# Patient Record
Sex: Female | Born: 1995 | Race: White | Hispanic: No | Marital: Single | Smoking: Current every day smoker
Health system: Southern US, Community
[De-identification: ages and names within clinical notes are randomized; demographics above are authoritative.]

## PROBLEM LIST (undated history)

## (undated) DIAGNOSIS — E663 Overweight: Secondary | ICD-10-CM

## (undated) DIAGNOSIS — L309 Dermatitis, unspecified: Secondary | ICD-10-CM

## (undated) DIAGNOSIS — J45909 Unspecified asthma, uncomplicated: Secondary | ICD-10-CM

## (undated) DIAGNOSIS — F419 Anxiety disorder, unspecified: Secondary | ICD-10-CM

## (undated) DIAGNOSIS — L409 Psoriasis, unspecified: Secondary | ICD-10-CM

## (undated) HISTORY — DX: Psoriasis, unspecified: L40.9

## (undated) HISTORY — DX: Dermatitis, unspecified: L30.9

## (undated) HISTORY — DX: Overweight: E66.3

## (undated) HISTORY — DX: Unspecified asthma, uncomplicated: J45.909

---
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
## 2016-02-14 ENCOUNTER — Encounter: Payer: Self-pay | Admitting: Pediatrics

## 2016-08-28 ENCOUNTER — Encounter (HOSPITAL_COMMUNITY): Payer: Self-pay | Admitting: *Deleted

## 2016-08-28 ENCOUNTER — Emergency Department (HOSPITAL_COMMUNITY)
Admission: EM | Admit: 2016-08-28 | Discharge: 2016-08-28 | Disposition: A | Discharge status: Home / Self Care | Payer: Medicaid Other | Attending: Emergency Medicine | Admitting: Emergency Medicine

## 2016-08-28 DIAGNOSIS — Z79899 Other long term (current) drug therapy: Secondary | ICD-10-CM | POA: Diagnosis not present

## 2016-08-28 DIAGNOSIS — K529 Noninfective gastroenteritis and colitis, unspecified: Secondary | ICD-10-CM | POA: Diagnosis not present

## 2016-08-28 DIAGNOSIS — F17210 Nicotine dependence, cigarettes, uncomplicated: Secondary | ICD-10-CM | POA: Diagnosis not present

## 2016-08-28 DIAGNOSIS — J4540 Moderate persistent asthma, uncomplicated: Secondary | ICD-10-CM | POA: Diagnosis not present

## 2016-08-28 DIAGNOSIS — R112 Nausea with vomiting, unspecified: Secondary | ICD-10-CM | POA: Diagnosis present

## 2016-08-28 LAB — URINALYSIS, ROUTINE W REFLEX MICROSCOPIC
BILIRUBIN URINE: NEGATIVE
Glucose, UA: NEGATIVE mg/dL
HGB URINE DIPSTICK: NEGATIVE
KETONES UR: NEGATIVE mg/dL
Leukocytes, UA: NEGATIVE
Nitrite: NEGATIVE
PROTEIN: NEGATIVE mg/dL
Specific Gravity, Urine: 1.02 (ref 1.005–1.030)
pH: 7 (ref 5.0–8.0)

## 2016-08-28 LAB — CBC
HCT: 40.7 % (ref 36.0–46.0)
Hemoglobin: 13.9 g/dL (ref 12.0–15.0)
MCH: 29.4 pg (ref 26.0–34.0)
MCHC: 34.2 g/dL (ref 30.0–36.0)
MCV: 86 fL (ref 78.0–100.0)
PLATELETS: 288 10*3/uL (ref 150–400)
RBC: 4.73 MIL/uL (ref 3.87–5.11)
RDW: 13.1 % (ref 11.5–15.5)
WBC: 13 10*3/uL — ABNORMAL HIGH (ref 4.0–10.5)

## 2016-08-28 LAB — COMPREHENSIVE METABOLIC PANEL
ALK PHOS: 82 U/L (ref 38–126)
ALT: 23 U/L (ref 14–54)
AST: 20 U/L (ref 15–41)
Albumin: 4.4 g/dL (ref 3.5–5.0)
Anion gap: 7 (ref 5–15)
BUN: 15 mg/dL (ref 6–20)
CALCIUM: 9 mg/dL (ref 8.9–10.3)
CO2: 24 mmol/L (ref 22–32)
CREATININE: 0.76 mg/dL (ref 0.44–1.00)
Chloride: 105 mmol/L (ref 101–111)
GFR calc non Af Amer: >60 mL/min (ref >60)
Glucose, Bld: 84 mg/dL (ref 65–99)
Potassium: 4 mmol/L (ref 3.5–5.1)
SODIUM: 136 mmol/L (ref 135–145)
Total Bilirubin: 0.6 mg/dL (ref 0.3–1.2)
Total Protein: 7.7 g/dL (ref 6.5–8.1)

## 2016-08-28 LAB — LIPASE, BLOOD: Lipase: 16 U/L (ref 11–51)

## 2016-08-28 LAB — PREGNANCY, URINE: PREG TEST UR: NEGATIVE

## 2016-08-28 MED ORDER — SODIUM CHLORIDE 0.9 % IV BOLUS (SEPSIS)
1000.0000 mL | Freq: Once | INTRAVENOUS | Status: AC
  Administered 2016-08-28: 1000 mL via INTRAVENOUS

## 2016-08-28 MED ORDER — ONDANSETRON HCL 4 MG/2ML IJ SOLN
4.0000 mg | Freq: Once | INTRAMUSCULAR | Status: AC
  Administered 2016-08-28: 4 mg via INTRAVENOUS
  Filled 2016-08-28: qty 2

## 2016-08-28 MED ORDER — ONDANSETRON HCL 4 MG PO TABS
4.0000 mg | ORAL_TABLET | Freq: Four times a day (QID) | ORAL | 0 refills | Status: DC
Start: 2016-08-28 — End: 2023-10-13

## 2016-08-28 NOTE — ED Triage Notes (Signed)
Pt has been having n/v/d for 4 days. States she has intermittent abdominal pain. States the person who lives with her was sick with the same symptoms. NAD noted.

## 2016-08-28 NOTE — ED Provider Notes (Signed)
[facility]-EMERGENCY DEPT Provider Note   CSN: [id number] Arrival date & time: 08/28/16  1336     History   Chief Complaint Chief Complaint  Patient presents with  . Abdominal Pain    HPI Savannah Contreras is a 21 y.o. female.  HPI Pt started having vomiting and diarrhea on Tuesday.  She has had 1-2 episodes of loose stools per day.  She has vomited up to 7 time per day though over the last few days.  She has persistent nausea and cannot eat or drink much.  She has stomach pain in the upper abdomen with vomiting.   No dysuria but decreased urine output.  She started to feel dizzy and lightheaded.  She talked to her doctor and was told to come to the ED.  Past Medical History:  Diagnosis Date  . Asthma   . Eczema   . Overweight 03/31/2013  . Psoriasis 03/31/2013  . Unspecified asthma(493.90) 03/31/2013    Patient Active Problem List   Diagnosis Date Noted  . Moderate persistent asthma 12/06/2013  . Allergic rhinitis 12/06/2013  . Unspecified asthma(493.90) 03/31/2013  . Overweight 03/31/2013  . Psoriasis 03/31/2013  . Ankle sprain 10/06/2012  . High ankle sprain 10/06/2012    History reviewed. No pertinent surgical history.  OB History    No data available       Home Medications    Prior to Admission medications   Medication Sig Start Date End Date Taking? Authorizing Provider  albuterol (PROVENTIL HFA;VENTOLIN HFA) 108 (90 Base) MCG/ACT inhaler Inhale 2 puffs into the lungs every 6 (six) hours as needed for wheezing or shortness of breath. 11/08/15  Yes Lurene Shadow, MD  Fructose-Dextrose-Phosphor Acd (ANTI-NAUSEA PO) Take 1 tablet by mouth once as needed (for nausea).   Yes Historical Provider, MD  Melatonin 5 MG TBDP Take 5 mg by mouth at bedtime.   Yes Historical Provider, MD  ondansetron (ZOFRAN) 4 MG tablet Take 1 tablet (4 mg total) by mouth every 6 (six) hours. 08/28/16   Linwood Dibbles, MD    Family History Family History  Problem Relation Age of Onset    . Heart disease    . Cancer    . Lung disease    . Diabetes      Social History Social History  Substance Use Topics  . Smoking status: Current Every Day Smoker    Packs/day: 1.00    Types: Cigarettes  . Smokeless tobacco: Never Used  . Alcohol use No     Allergies   Patient has no known allergies.   Review of Systems Review of Systems  All other systems reviewed and are negative.    Physical Exam Updated Vital Signs BP 116/66 (BP Location: Left Arm)   Pulse 87   Temp 97.5 F (36.4 C) (Oral)   Resp 15   Ht 5\' 6"  (1.676 m)   Wt 104.3 kg   LMP 05/28/2016 Comment: irregular  SpO2 98%   BMI 37.12 kg/m   Physical Exam  Constitutional: She appears well-developed and well-nourished. No distress.  HENT:  Head: Normocephalic and atraumatic.  Right Ear: External ear normal.  Left Ear: External ear normal.  Eyes: Conjunctivae are normal. Right eye exhibits no discharge. Left eye exhibits no discharge. No scleral icterus.  Neck: Neck supple. No tracheal deviation present.  Cardiovascular: Normal rate, regular rhythm and intact distal pulses.   Pulmonary/Chest: Effort normal and breath sounds normal. No stridor. No respiratory distress. She has no wheezes. She has  no rales.  Abdominal: Soft. Bowel sounds are normal. She exhibits no distension. There is no tenderness. There is no rebound and no guarding.  Musculoskeletal: She exhibits no edema or tenderness.  Neurological: She is alert. She has normal strength. No cranial nerve deficit (no facial droop, extraocular movements intact, no slurred speech) or sensory deficit. She exhibits normal muscle tone. She displays no seizure activity. Coordination normal.  Skin: Skin is warm and dry. No rash noted.  Psychiatric: She has a normal mood and affect.  Nursing note and vitals reviewed.    ED Treatments / Results  Labs (all labs ordered are listed, but only abnormal results are displayed) Labs Reviewed  CBC - Abnormal;  Notable for the following:       Result Value   WBC 13.0 (*)    All other components within normal limits  LIPASE, BLOOD  COMPREHENSIVE METABOLIC PANEL  URINALYSIS, ROUTINE W REFLEX MICROSCOPIC  PREGNANCY, URINE     Procedures Procedures (including critical care time)  Medications Ordered in ED Medications  sodium chloride 0.9 % bolus 1,000 mL (0 mLs Intravenous Stopped 08/28/16 2117)  ondansetron (ZOFRAN) injection 4 mg (4 mg Intravenous Given 08/28/16 2018)     Initial Impression / Assessment and Plan / ED Course  I have reviewed the triage vital signs and the nursing notes.  Pertinent labs & imaging results that were available during my care of the patient were reviewed by me and considered in my medical decision making (see chart for details).  Clinical Course     Benign exam.  Lab tests are reassuring.  Sx improved with treatment.  Suspect viral GE.  Doubt appendicitis, more serious infection.  Dc home with antiemetics.  Final Clinical Impressions(s) / ED Diagnoses   Final diagnoses:  Gastroenteritis    New Prescriptions Discharge Medication List as of 08/28/2016  9:31 PM    START taking these medications   Details  ondansetron (ZOFRAN) 4 MG tablet Take 1 tablet (4 mg total) by mouth every 6 (six) hours., Starting Thu 08/28/2016, Print         Linwood DibblesJon Sahasra Belue, MD 09/01/16 1101

## 2016-10-04 ENCOUNTER — Encounter (HOSPITAL_COMMUNITY): Payer: Self-pay | Admitting: Emergency Medicine

## 2016-10-04 ENCOUNTER — Emergency Department (HOSPITAL_COMMUNITY)
Admission: EM | Admit: 2016-10-04 | Discharge: 2016-10-04 | Disposition: A | Discharge status: Home / Self Care | Payer: Self-pay | Attending: Emergency Medicine | Admitting: Emergency Medicine

## 2016-10-04 ENCOUNTER — Emergency Department (HOSPITAL_COMMUNITY): Payer: Self-pay

## 2016-10-04 DIAGNOSIS — F17210 Nicotine dependence, cigarettes, uncomplicated: Secondary | ICD-10-CM | POA: Insufficient documentation

## 2016-10-04 DIAGNOSIS — Y999 Unspecified external cause status: Secondary | ICD-10-CM | POA: Insufficient documentation

## 2016-10-04 DIAGNOSIS — W010XXA Fall on same level from slipping, tripping and stumbling without subsequent striking against object, initial encounter: Secondary | ICD-10-CM | POA: Insufficient documentation

## 2016-10-04 DIAGNOSIS — S93402A Sprain of unspecified ligament of left ankle, initial encounter: Secondary | ICD-10-CM | POA: Insufficient documentation

## 2016-10-04 DIAGNOSIS — Y929 Unspecified place or not applicable: Secondary | ICD-10-CM | POA: Insufficient documentation

## 2016-10-04 DIAGNOSIS — J45909 Unspecified asthma, uncomplicated: Secondary | ICD-10-CM | POA: Insufficient documentation

## 2016-10-04 DIAGNOSIS — Y9389 Activity, other specified: Secondary | ICD-10-CM | POA: Insufficient documentation

## 2016-10-04 DIAGNOSIS — Z79899 Other long term (current) drug therapy: Secondary | ICD-10-CM | POA: Insufficient documentation

## 2016-10-04 MED ORDER — IBUPROFEN 600 MG PO TABS: 600.0000 mg | ORAL_TABLET | Freq: Four times a day (QID) | ORAL | 0 refills | Status: DC | PRN

## 2016-10-04 NOTE — ED Triage Notes (Signed)
Patient c/o left ankle pain. Per patient injured foot approx 30-35 minutes ago by" stepping off porch wrong." Patient reports hearing a "cracking noise."

## 2016-10-04 NOTE — Discharge Instructions (Signed)
Wear the ASO and use crutches to avoid weight bearing.  Use ice and elevation as much as possible for the next several days to help reduce the swelling.  Take the medications prescribed.   Use the ibuprofen also for pain and inflammation.  Call the orthopedic doctor listed for a recheck of your injury if you are not able to come off the crutches by then as discussed.    You may benefit from physical therapy of your ankle if it is not getting better over the next week.

## 2016-10-04 NOTE — ED Provider Notes (Signed)
[facility]-EMERGENCY DEPT Provider Note   CSN: [id number] Arrival date & time: 10/04/16  1627     History   Chief Complaint Chief Complaint  Patient presents with  . Ankle Pain    HPI Savannah Contreras is a 21 y.o. female presenting with left ankle pain which occurred suddenly when the patient inverted the ankle joint when she tripped and fell off a step.  Pain is aching, constant and worse with palpation, movement and weight bearing.  The patient was able to weight bear immediately after the event.  There is no radiation of pain and the patient denies numbness distal to the injury site.  She has had no treatment prior to arrival.  The history is provided by the patient.    Past Medical History:  Diagnosis Date  . Asthma   . Eczema   . Overweight 03/31/2013  . Psoriasis 03/31/2013  . Unspecified asthma(493.90) 03/31/2013    Patient Active Problem List   Diagnosis Date Noted  . Moderate persistent asthma 12/06/2013  . Allergic rhinitis 12/06/2013  . Unspecified asthma(493.90) 03/31/2013  . Overweight 03/31/2013  . Psoriasis 03/31/2013  . Ankle sprain 10/06/2012  . High ankle sprain 10/06/2012    History reviewed. No pertinent surgical history.  OB History    No data available       Home Medications    Prior to Admission medications   Medication Sig Start Date End Date Taking? Authorizing Provider  albuterol (PROVENTIL HFA;VENTOLIN HFA) 108 (90 Base) MCG/ACT inhaler Inhale 2 puffs into the lungs every 6 (six) hours as needed for wheezing or shortness of breath. 11/08/15   Lurene ShadowKavithashree Gnanasekaran, MD  Fructose-Dextrose-Phosphor Acd (ANTI-NAUSEA PO) Take 1 tablet by mouth once as needed (for nausea).    Historical Provider, MD  ibuprofen (ADVIL,MOTRIN) 600 MG tablet Take 1 tablet (600 mg total) by mouth every 6 (six) hours as needed. 10/04/16   Burgess AmorJulie Tiye Huwe, PA-C  Melatonin 5 MG TBDP Take 5 mg by mouth at bedtime.    Historical Provider, MD  ondansetron (ZOFRAN) 4 MG tablet  Take 1 tablet (4 mg total) by mouth every 6 (six) hours. 08/28/16   Linwood DibblesJon Knapp, MD    Family History Family History  Problem Relation Age of Onset  . Heart disease    . Cancer    . Lung disease    . Diabetes      Social History Social History  Substance Use Topics  . Smoking status: Current Every Day Smoker    Packs/day: 1.00    Years: 2.00    Types: Cigarettes  . Smokeless tobacco: Never Used  . Alcohol use No     Allergies   Patient has no known allergies.   Review of Systems Review of Systems  Musculoskeletal: Positive for arthralgias and joint swelling.  Skin: Negative for wound.  Neurological: Negative for weakness and numbness.     Physical Exam Updated Vital Signs BP 119/75 (BP Location: Left Arm)   Pulse 84   Temp 98.2 F (36.8 C) (Oral)   Resp 18   Ht 5\' 6"  (1.676 m)   Wt 110.2 kg   LMP 10/04/2016   SpO2 99%   BMI 39.22 kg/m   Physical Exam  Constitutional: She appears well-developed and well-nourished.  HENT:  Head: Normocephalic.  Cardiovascular: Normal rate and intact distal pulses.  Exam reveals no decreased pulses.   Pulses:      Dorsalis pedis pulses are 2+ on the right side, and 2+ on the  left side.       Posterior tibial pulses are 2+ on the right side, and 2+ on the left side.  Musculoskeletal: She exhibits edema and tenderness.       Right ankle: Normal.       Left ankle: She exhibits decreased range of motion and swelling. She exhibits no ecchymosis, no deformity and normal pulse. Tenderness. Lateral malleolus tenderness found. No head of 5th metatarsal and no proximal fibula tenderness found. Achilles tendon normal.  Neurological: She is alert. No sensory deficit.  Skin: Skin is warm, dry and intact.  Nursing note and vitals reviewed.    ED Treatments / Results  Labs (all labs ordered are listed, but only abnormal results are displayed) Labs Reviewed - No data to display  EKG  EKG Interpretation None       Radiology Dg  Ankle Complete Left  Result Date: 10/04/2016 CLINICAL DATA:  Injured foot 30-35 minutes ago stepping off a porch wrong, heard a cracking noise, LEFT ankle pain, initial encounter EXAM: LEFT ANKLE COMPLETE - 3+ VIEW COMPARISON:  None FINDINGS: Soft tissue swelling greatest laterally. Osseous mineralization normal. Joint spaces preserved. No acute fracture, dislocation, or bone destruction. IMPRESSION: No acute abnormalities. Electronically Signed   By: Ulyses Southward M.D.   On: 10/04/2016 17:35    Procedures Procedures (including critical care time)  Medications Ordered in ED Medications - No data to display   Initial Impression / Assessment and Plan / ED Course  I have reviewed the triage vital signs and the nursing notes.  Pertinent labs & imaging results that were available during my care of the patient were reviewed by me and considered in my medical decision making (see chart for details).    RICE, aso, crutches. Prn f/u with ortho for persistent sx.  Final Clinical Impressions(s) / ED Diagnoses   Final diagnoses:  Sprain of left ankle, unspecified ligament, initial encounter    New Prescriptions New Prescriptions   IBUPROFEN (ADVIL,MOTRIN) 600 MG TABLET    Take 1 tablet (600 mg total) by mouth every 6 (six) hours as needed.     Burgess Amor, PA-C 10/04/16 1752    Linwood Dibbles, MD 10/05/16 [phone]

## 2017-11-29 ENCOUNTER — Encounter (HOSPITAL_COMMUNITY): Payer: Self-pay | Admitting: *Deleted

## 2017-11-29 ENCOUNTER — Emergency Department (HOSPITAL_COMMUNITY)
Admission: EM | Admit: 2017-11-29 | Discharge: 2017-11-29 | Disposition: A | Discharge status: Home / Self Care | Payer: Self-pay | Attending: Emergency Medicine | Admitting: Emergency Medicine

## 2017-11-29 ENCOUNTER — Other Ambulatory Visit: Payer: Self-pay

## 2017-11-29 DIAGNOSIS — J45909 Unspecified asthma, uncomplicated: Secondary | ICD-10-CM | POA: Insufficient documentation

## 2017-11-29 DIAGNOSIS — R064 Hyperventilation: Secondary | ICD-10-CM | POA: Insufficient documentation

## 2017-11-29 DIAGNOSIS — F419 Anxiety disorder, unspecified: Secondary | ICD-10-CM | POA: Insufficient documentation

## 2017-11-29 DIAGNOSIS — F17210 Nicotine dependence, cigarettes, uncomplicated: Secondary | ICD-10-CM | POA: Insufficient documentation

## 2017-11-29 DIAGNOSIS — Z79899 Other long term (current) drug therapy: Secondary | ICD-10-CM | POA: Insufficient documentation

## 2017-11-29 HISTORY — DX: Anxiety disorder, unspecified: F41.9

## 2017-11-29 MED ORDER — LORAZEPAM 1 MG PO TABS
1.0000 mg | ORAL_TABLET | Freq: Once | ORAL | Status: AC
  Administered 2017-11-29: 1 mg via ORAL
  Filled 2017-11-29: qty 1

## 2017-11-29 NOTE — Discharge Instructions (Addendum)
Look at the resource guide to be evaluated for your anxiety and depression. Return to the ED if you feel like you may do something to harm yourself.

## 2017-11-29 NOTE — ED Triage Notes (Signed)
Pt states that she was at work when she felt "hot" started feeling dizzy, sob, was having tingling in her hands and face area, denies any pain,

## 2017-11-29 NOTE — ED Provider Notes (Signed)
[facility] EMERGENCY DEPARTMENT Provider Note   CSN: [id number] Arrival date & time: 11/29/17  0040  Time seen 12:55 AM   History   Chief Complaint Chief Complaint  Patient presents with  . Shortness of Breath    HPI Savannah Contreras is a 22 y.o. female.  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  She states they were spraying for bugs at the time she was working which is not usual.  She states they normally spray before the cleaning people arrived.  She states about 20 minutes after getting to work she started feeling dizzy, her face was tingling as well as her hands and feet and she felt dyspnea on exertion.  She denies wheezing and states she has had asthma in the past and this feels different.  Her mother states patient has lots of anxiety.  She has had a lot of stress this week because her friend's mother was murdered.  Patient's mother states "she is very sensitive to everything".  Mother also expresses concern that the patient is depressed, she states she sleeps all the time, she does not eat well, she is sad and cries a lot.  Patient denies being depressed.  Mother states she has had no concerned that the patient is suicidal.  Mother states she had an episode of anxiety attack once that was bad enough they called EMS.  That was several years ago.  Patient also reports her smoking has increased recently.  PCP Patient, No Pcp Per   Past Medical History:  Diagnosis Date  . Anxiety   . Asthma   . Eczema   . Overweight 03/31/2013  . Psoriasis 03/31/2013  . Unspecified asthma(493.90) 03/31/2013    Patient Active Problem List   Diagnosis Date Noted  . Moderate persistent asthma 12/06/2013  . Allergic rhinitis 12/06/2013  . Unspecified asthma(493.90) 03/31/2013  . Overweight 03/31/2013  . Psoriasis 03/31/2013  . Ankle sprain 10/06/2012  . High ankle sprain 10/06/2012    History reviewed. No pertinent surgical history.   OB History   None       Home Medications    Prior to Admission medications   Medication Sig Start Date End Date Taking? Authorizing Provider  albuterol (PROVENTIL HFA;VENTOLIN HFA) 108 (90 Base) MCG/ACT inhaler Inhale 2 puffs into the lungs every 6 (six) hours as needed for wheezing or shortness of breath. 11/08/15   Lurene Shadow, MD  Fructose-Dextrose-Phosphor Acd (ANTI-NAUSEA PO) Take 1 tablet by mouth once as needed (for nausea).    [provider]  ibuprofen (ADVIL,MOTRIN) 600 MG tablet Take 1 tablet (600 mg total) by mouth every 6 (six) hours as needed. 10/04/16   Burgess Amor, PA-C  Melatonin 5 MG TBDP Take 5 mg by mouth at bedtime.    [provider]  ondansetron (ZOFRAN) 4 MG tablet Take 1 tablet (4 mg total) by mouth every 6 (six) hours. 08/28/16   Linwood Dibbles, MD    Family History Family History  Problem Relation Age of Onset  . Heart disease Unknown   . Cancer Unknown   . Lung disease Unknown   . Diabetes Unknown     Social History Social History   Tobacco Use  . Smoking status: Current Every Day Smoker    Packs/day: 1.00    Years: 2.00    Pack years: 2.00    Types: Cigarettes  . Smokeless tobacco: Never Used  Substance Use Topics  . Alcohol use: No  .  Drug use: No  employed Smoking 1 1/2 ppd Lives with GM   Allergies   Patient has no known allergies.   Review of Systems Review of Systems  All other systems reviewed and are negative.    Physical Exam Updated Vital Signs BP 104/64   Pulse 61   Temp 98.6 F (37 C) (Oral)   Resp 17   Ht 5\' 6"  (1.676 m)   Wt 107 kg (236 lb)   LMP 11/22/2017   SpO2 95%   BMI 38.09 kg/m   Physical Exam  Constitutional: She is oriented to person, place, and time. She appears well-developed and well-nourished.  Non-toxic appearance. She does not appear ill. No distress.  HENT:  Head: Normocephalic and atraumatic.  Right Ear: External ear normal.  Left Ear: External ear normal.  Nose: Nose normal. No  mucosal edema or rhinorrhea.  Mouth/Throat: Oropharynx is clear and moist and mucous membranes are normal. No dental abscesses or uvula swelling.  Eyes: Pupils are equal, round, and reactive to light. Conjunctivae and EOM are normal.  Neck: Normal range of motion and full passive range of motion without pain. Neck supple.  Cardiovascular: Normal rate, regular rhythm and normal heart sounds. Exam reveals no gallop and no friction rub.  No murmur heard. Pulmonary/Chest: Effort normal and breath sounds normal. Tachypnea noted. No respiratory distress. She has no wheezes. She has no rhonchi. She has no rales. She exhibits no tenderness and no crepitus.  Abdominal: Soft. Normal appearance and bowel sounds are normal. She exhibits no distension. There is no tenderness. There is no rebound and no guarding.  Musculoskeletal: Normal range of motion. She exhibits no edema or tenderness.  Moves all extremities well.   Neurological: She is alert and oriented to person, place, and time. She has normal strength. No cranial nerve deficit.  She is complaining of her face, hands, and feet being tingly, she describes early carpal pedal spasm prior to coming to the ED but not currently.  Skin: Skin is warm, dry and intact. No rash noted. No erythema. No pallor.  Psychiatric: Her speech is normal and behavior is normal. Her mood appears anxious.  Nursing note and vitals reviewed.    ED Treatments / Results  Labs (all labs ordered are listed, but only abnormal results are displayed) Labs Reviewed - No data to display  EKG None  Radiology No results found.  Procedures Procedures (including critical care time)  Medications Ordered in ED Medications  LORazepam (ATIVAN) tablet 1 mg (1 mg Oral Given 11/29/17 0113)     Initial Impression / Assessment and Plan / ED Course  I have reviewed the triage vital signs and the nursing notes.  Pertinent labs & imaging results that were available during my care of  the patient were reviewed by me and considered in my medical decision making (see chart for details).     Patient appears to be having a panic attack and hyperventilation.  She was given Ativan orally.  Recheck at 1:50 AM patient appears to be much calmer.  She is no longer appearing tachypneic.  She states the numbness is gone although sometimes it comes back briefly.  This point she was felt to be stable at home for discharge.  Final Clinical Impressions(s) / ED Diagnoses   Final diagnoses:  Anxiety  Hyperventilation    ED Discharge Orders    None     Plan discharge  Devoria AlbeIva Javonni Macke, MD, Concha PyoFACEP    Denetria Luevanos, MD 11/29/17 [phone]

## 2018-03-11 IMAGING — DX DG ANKLE COMPLETE 3+V*L*
3 series · 3 of 3 positions shown · non-contrast
Comparison: None

CLINICAL DATA: Injured foot 30-35 minutes ago stepping off a porch
wrong, heard a cracking noise, LEFT ankle pain, initial encounter

EXAM:
LEFT ANKLE COMPLETE - 3+ VIEW

[ankle ap]
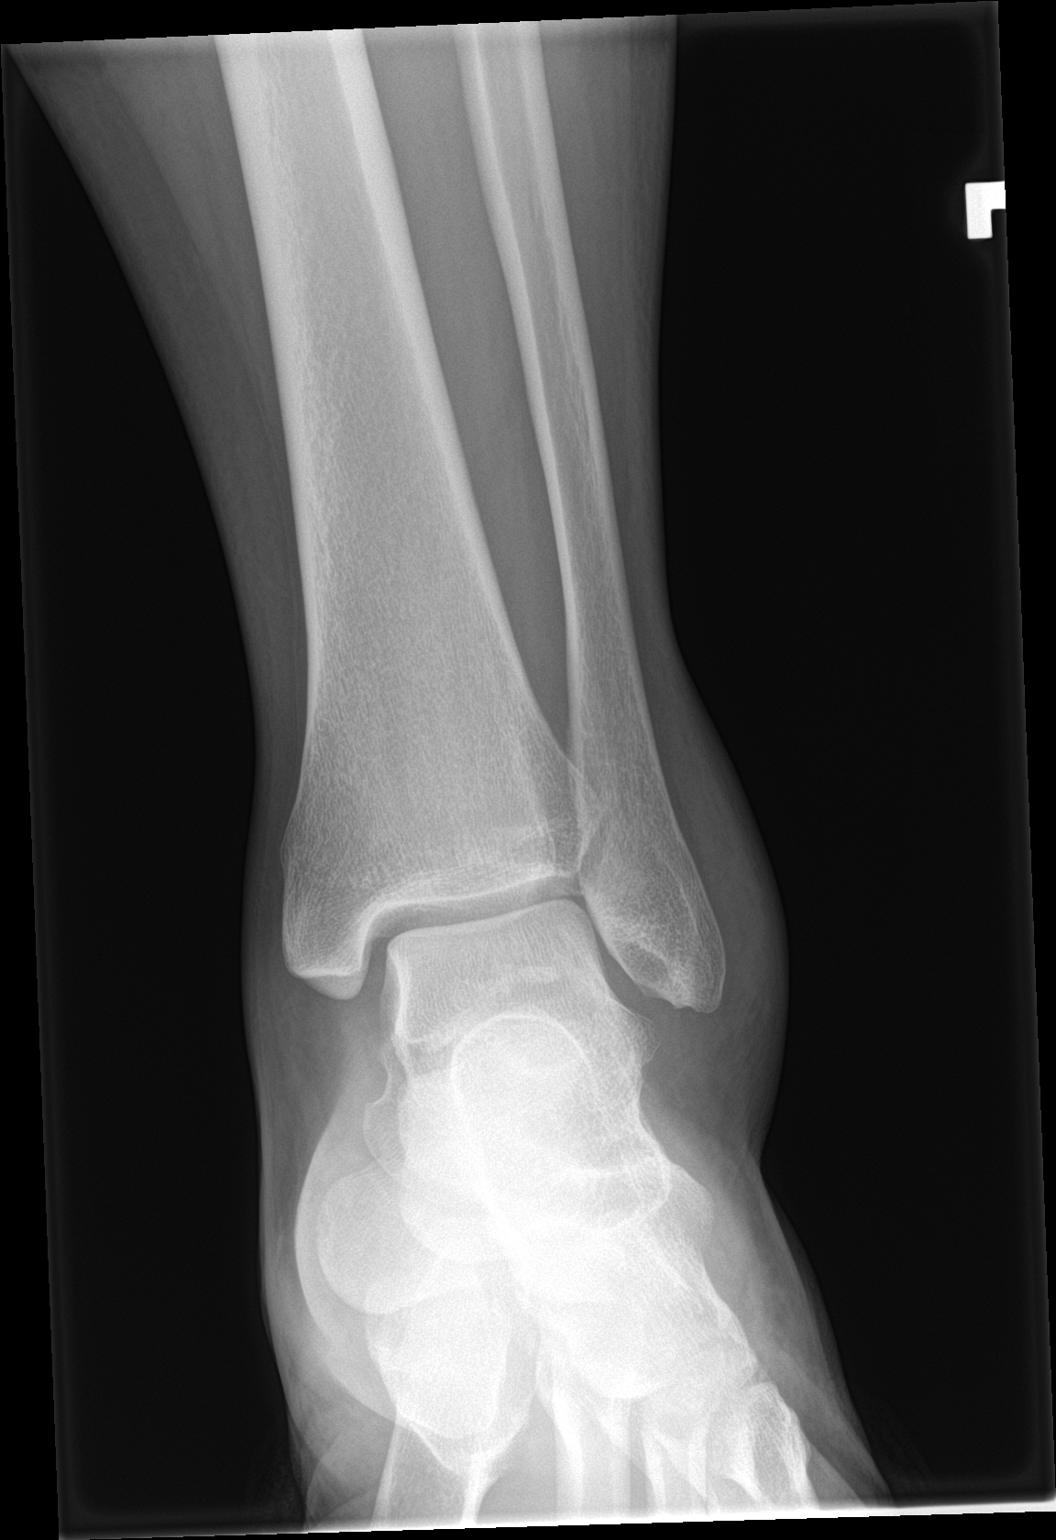

[ankle obl]
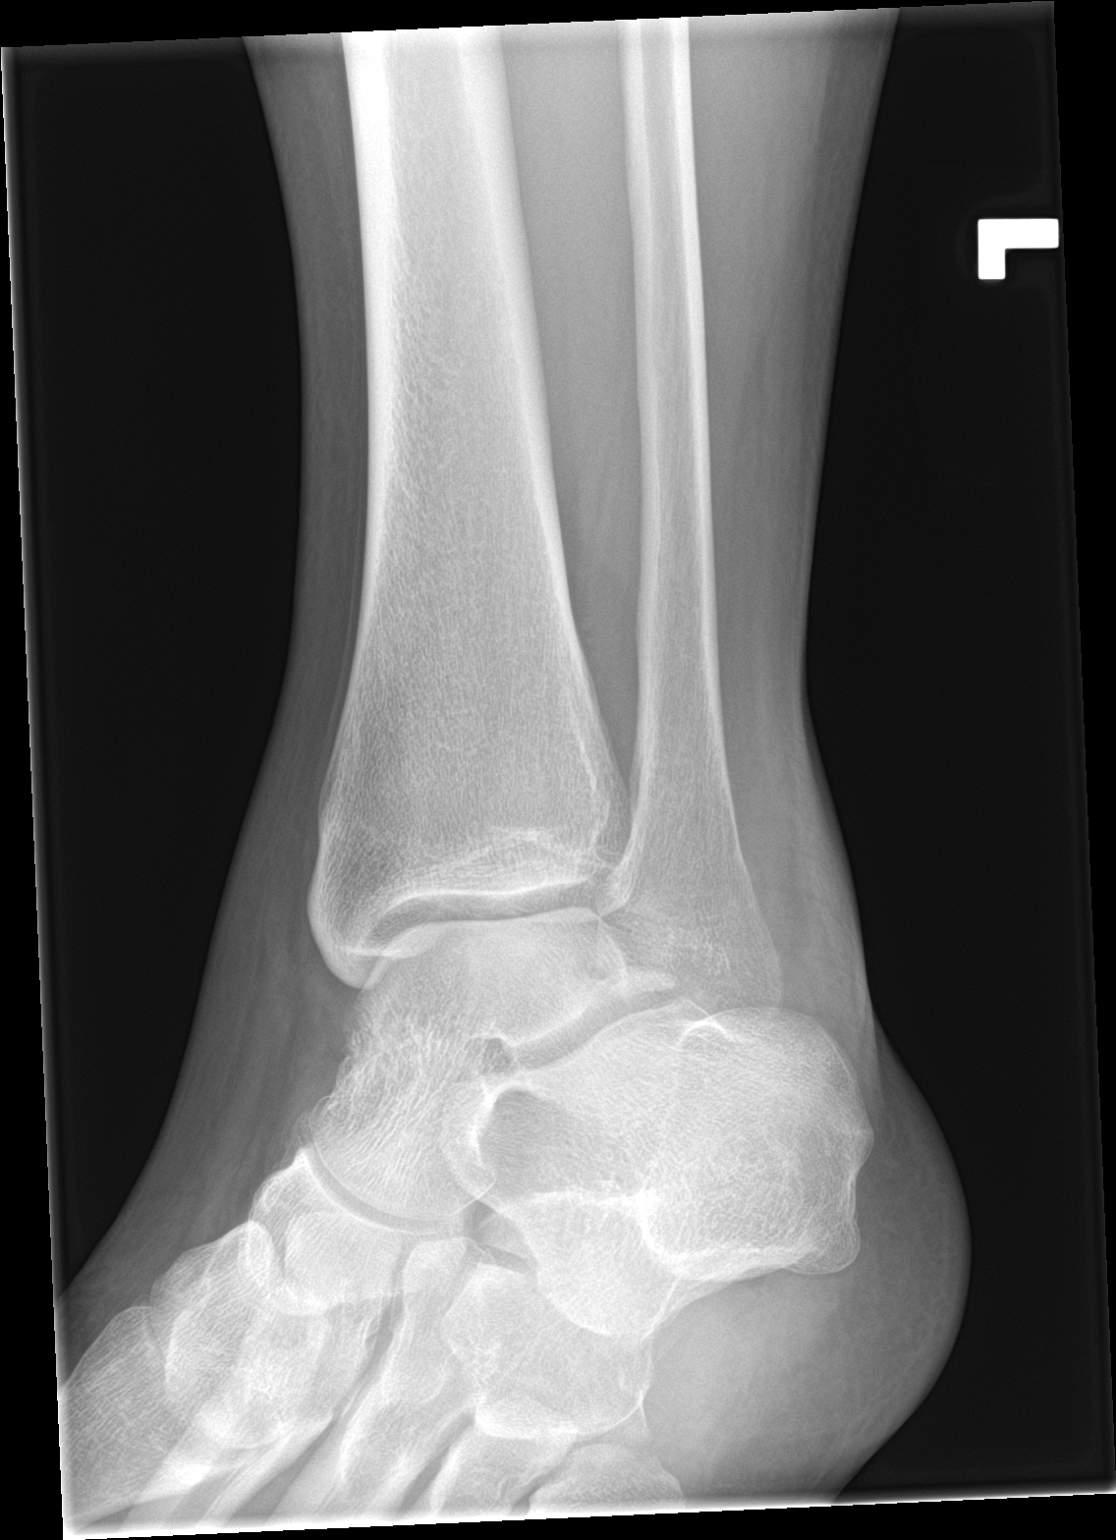

[ankle lat]
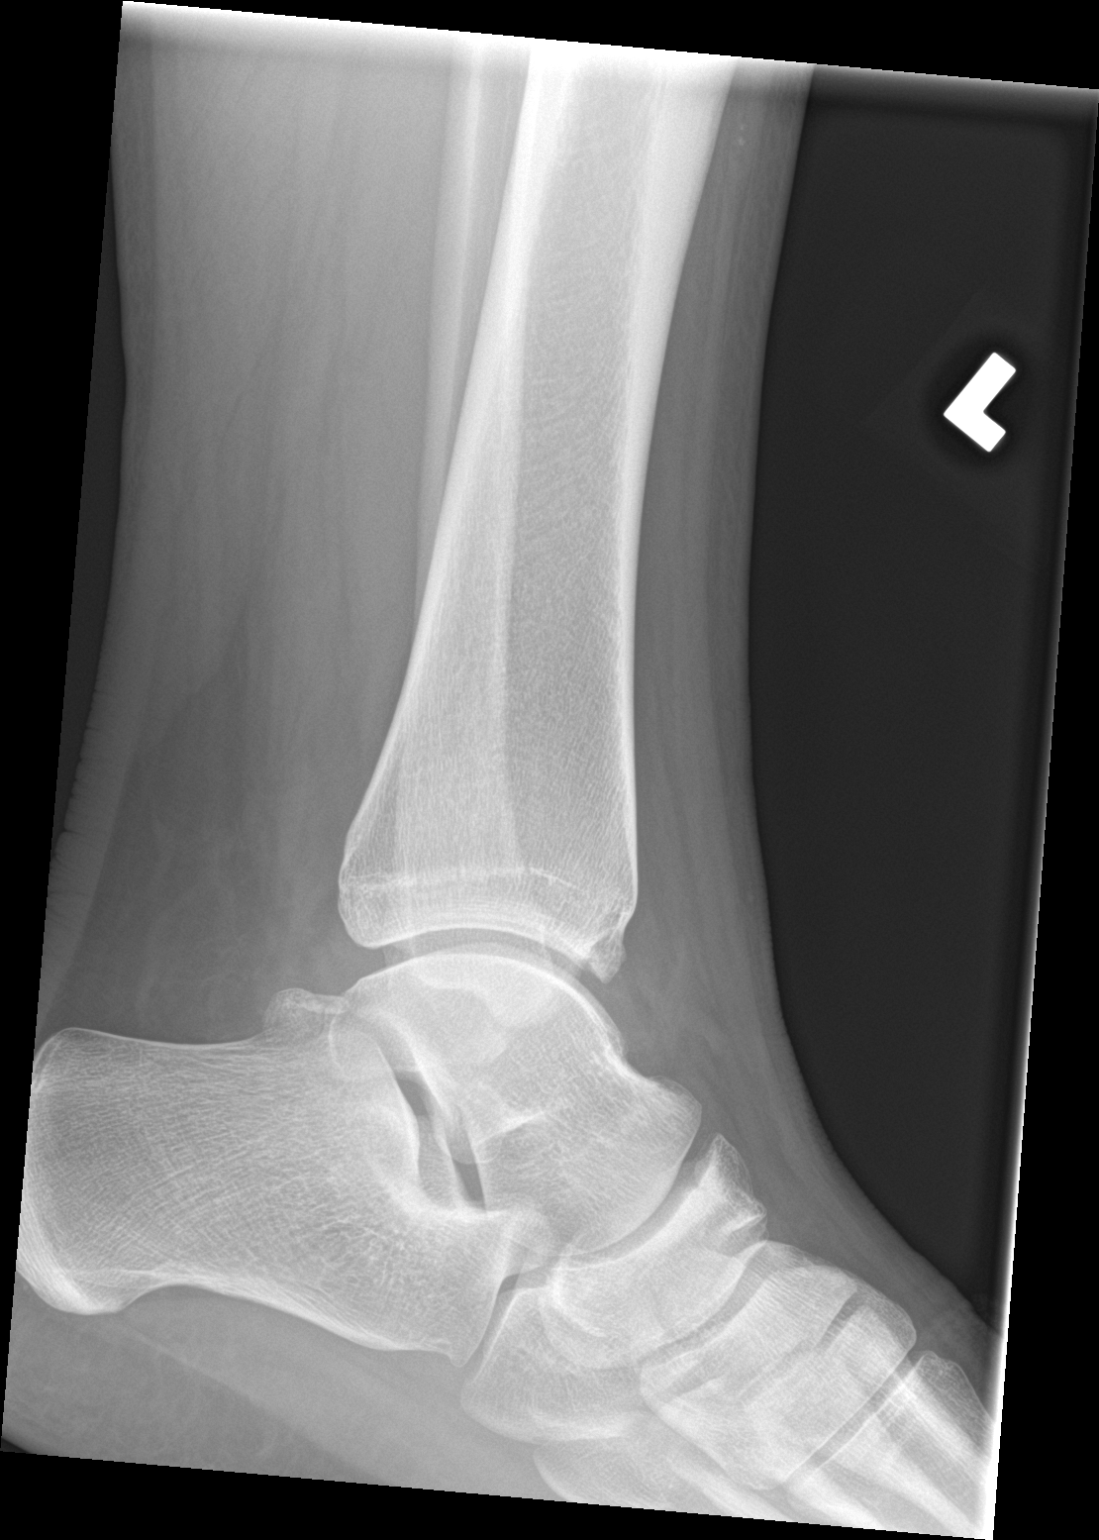

[3 of 3 positions shown; findings below may reference images not displayed]

FINDINGS: Soft tissue swelling greatest laterally.

Osseous mineralization normal.

Joint spaces preserved.

No acute fracture, dislocation, or bone destruction.
IMPRESSION: No acute abnormalities.

## 2019-11-08 ENCOUNTER — Encounter (HOSPITAL_COMMUNITY): Payer: Self-pay | Admitting: Emergency Medicine

## 2019-11-08 ENCOUNTER — Emergency Department (HOSPITAL_COMMUNITY)
Admission: EM | Admit: 2019-11-08 | Discharge: 2019-11-08 | Disposition: A | Discharge status: Home / Self Care | Payer: Self-pay | Attending: Emergency Medicine | Admitting: Emergency Medicine

## 2019-11-08 ENCOUNTER — Other Ambulatory Visit: Payer: Self-pay

## 2019-11-08 DIAGNOSIS — Z5321 Procedure and treatment not carried out due to patient leaving prior to being seen by health care provider: Secondary | ICD-10-CM | POA: Insufficient documentation

## 2019-11-08 DIAGNOSIS — R112 Nausea with vomiting, unspecified: Secondary | ICD-10-CM | POA: Insufficient documentation

## 2019-11-08 NOTE — ED Triage Notes (Signed)
Pt arrives to ED from home with complaints of nausea and vomiting starting this morning around 3am. Patient states they were drinking and he had about 3 bootleggers last night which is not normal for them. Patient denies abdominal pain, diarrhea, and any other pain. Patients vomit was yellow and has vomited around 5 times.

## 2019-11-08 NOTE — ED Notes (Signed)
Pt left ama 

## 2020-11-26 ENCOUNTER — Other Ambulatory Visit: Payer: Self-pay

## 2020-11-26 ENCOUNTER — Emergency Department (HOSPITAL_COMMUNITY)
Admission: EM | Admit: 2020-11-26 | Discharge: 2020-11-26 | Disposition: A | Discharge status: Home / Self Care | Payer: Self-pay | Attending: Emergency Medicine | Admitting: Emergency Medicine

## 2020-11-26 ENCOUNTER — Encounter (HOSPITAL_COMMUNITY): Payer: Self-pay

## 2020-11-26 DIAGNOSIS — F17210 Nicotine dependence, cigarettes, uncomplicated: Secondary | ICD-10-CM | POA: Insufficient documentation

## 2020-11-26 DIAGNOSIS — J4540 Moderate persistent asthma, uncomplicated: Secondary | ICD-10-CM | POA: Insufficient documentation

## 2020-11-26 DIAGNOSIS — R112 Nausea with vomiting, unspecified: Secondary | ICD-10-CM | POA: Insufficient documentation

## 2020-11-26 LAB — URINALYSIS, ROUTINE W REFLEX MICROSCOPIC
Bilirubin Urine: NEGATIVE
Glucose, UA: NEGATIVE mg/dL
Ketones, ur: NEGATIVE mg/dL
Leukocytes,Ua: NEGATIVE
Nitrite: NEGATIVE
Protein, ur: NEGATIVE mg/dL
Specific Gravity, Urine: 1.025 (ref 1.005–1.030)
pH: 7 (ref 5.0–8.0)

## 2020-11-26 LAB — BASIC METABOLIC PANEL
Anion gap: 3 — ABNORMAL LOW (ref 5–15)
BUN: 16 mg/dL (ref 6–20)
CO2: 25 mmol/L (ref 22–32)
Calcium: 8.6 mg/dL — ABNORMAL LOW (ref 8.9–10.3)
Chloride: 107 mmol/L (ref 98–111)
Creatinine, Ser: 0.78 mg/dL (ref 0.44–1.00)
GFR, Estimated: >60 mL/min (ref >60)
Glucose, Bld: 97 mg/dL (ref 70–99)
Potassium: 4.1 mmol/L (ref 3.5–5.1)
Sodium: 135 mmol/L (ref 135–145)

## 2020-11-26 LAB — CBC WITH DIFFERENTIAL/PLATELET
Abs Immature Granulocytes: 0.04 10*3/uL (ref 0.00–0.07)
Basophils Absolute: 0.1 10*3/uL (ref 0.0–0.1)
Basophils Relative: 0 %
Eosinophils Absolute: 0.3 10*3/uL (ref 0.0–0.5)
Eosinophils Relative: 2 %
HCT: 40.2 % (ref 36.0–46.0)
Hemoglobin: 13.6 g/dL (ref 12.0–15.0)
Immature Granulocytes: 0 %
Lymphocytes Relative: 19 %
Lymphs Abs: 2.5 10*3/uL (ref 0.7–4.0)
MCH: 31.6 pg (ref 26.0–34.0)
MCHC: 33.8 g/dL (ref 30.0–36.0)
MCV: 93.3 fL (ref 80.0–100.0)
Monocytes Absolute: 0.6 10*3/uL (ref 0.1–1.0)
Monocytes Relative: 4 %
Neutro Abs: 9.3 10*3/uL — ABNORMAL HIGH (ref 1.7–7.7)
Neutrophils Relative %: 75 %
Platelets: 279 10*3/uL (ref 150–400)
RBC: 4.31 MIL/uL (ref 3.87–5.11)
RDW: 12.4 % (ref 11.5–15.5)
WBC: 12.7 10*3/uL — ABNORMAL HIGH (ref 4.0–10.5)
nRBC: 0 % (ref 0.0–0.2)

## 2020-11-26 LAB — I-STAT BETA HCG BLOOD, ED (MC, WL, AP ONLY): I-stat hCG, quantitative: <5 m[IU]/mL (ref <5)

## 2020-11-26 MED ORDER — ONDANSETRON HCL 4 MG/2ML IJ SOLN
4.0000 mg | Freq: Once | INTRAMUSCULAR | Status: AC
  Administered 2020-11-26: 4 mg via INTRAVENOUS
  Filled 2020-11-26: qty 2

## 2020-11-26 MED ORDER — SODIUM CHLORIDE 0.9 % IV BOLUS
1000.0000 mL | Freq: Once | INTRAVENOUS | Status: AC
  Administered 2020-11-26: 1000 mL via INTRAVENOUS

## 2020-11-26 MED ORDER — ONDANSETRON 4 MG PO TBDP: 4.0000 mg | ORAL_TABLET | Freq: Three times a day (TID) | ORAL | 0 refills | Status: DC | PRN

## 2020-11-26 NOTE — ED Notes (Signed)
PA Browning notified that pt passed fluid challenge

## 2020-11-26 NOTE — ED Triage Notes (Signed)
Pt from home. Pt c/o vomiting and weakness, started approximately 4 hrs ago. Pt denies pain at this time.

## 2020-11-26 NOTE — ED Notes (Signed)
Patient verbalized understanding of discharge instructions. Opportunity for questions and answers.  

## 2020-11-26 NOTE — ED Provider Notes (Signed)
[facility] EMERGENCY DEPARTMENT Provider Note   CSN: [id number] Arrival date & time: 11/26/20  4098     History Chief Complaint  Patient presents with  . Emesis    Savannah Contreras is a 25 y.o. female.  Patient presents to the emergency department with a chief complaint of nausea and vomiting.  She reports multiple episodes of vomiting that started about 4 hours ago.  She states that this feels similar to prior panic attacks.  She states that she has been feeling anxious.  She is now concerned that she is dehydrated.  She denies any abdominal pain.  Denies any dysuria.  Denies any treatments prior to arrival.  Denies any known sick contacts.  The history is provided by the patient. No language interpreter was used.       Past Medical History:  Diagnosis Date  . Anxiety   . Asthma   . Eczema   . Overweight 03/31/2013  . Psoriasis 03/31/2013  . Unspecified asthma(493.90) 03/31/2013    Patient Active Problem List   Diagnosis Date Noted  . Moderate persistent asthma 12/06/2013  . Allergic rhinitis 12/06/2013  . Unspecified asthma(493.90) 03/31/2013  . Overweight 03/31/2013  . Psoriasis 03/31/2013  . Ankle sprain 10/06/2012  . High ankle sprain 10/06/2012    History reviewed. No pertinent surgical history.   OB History   No obstetric history on file.     Family History  Problem Relation Age of Onset  . Heart disease Other   . Cancer Other   . Lung disease Other   . Diabetes Other     Social History   Tobacco Use  . Smoking status: Current Every Day Smoker    Packs/day: 1.00    Years: 2.00    Pack years: 2.00    Types: Cigarettes  . Smokeless tobacco: Never Used  Substance Use Topics  . Alcohol use: No  . Drug use: No    Home Medications Prior to Admission medications   Medication Sig Start Date End Date Taking? Authorizing Provider  albuterol (PROVENTIL HFA;VENTOLIN HFA) 108 (90 Base) MCG/ACT inhaler Inhale 2 puffs into the lungs  every 6 (six) hours as needed for wheezing or shortness of breath. 11/08/15   Lurene Shadow, MD  Fructose-Dextrose-Phosphor Acd (ANTI-NAUSEA PO) Take 1 tablet by mouth once as needed (for nausea).    [provider]  ibuprofen (ADVIL,MOTRIN) 600 MG tablet Take 1 tablet (600 mg total) by mouth every 6 (six) hours as needed. 10/04/16   Burgess Amor, PA-C  Melatonin 5 MG TBDP Take 5 mg by mouth at bedtime.    [provider]  ondansetron (ZOFRAN) 4 MG tablet Take 1 tablet (4 mg total) by mouth every 6 (six) hours. 08/28/16   Linwood Dibbles, MD    Allergies    Patient has no known allergies.  Review of Systems   Review of Systems  All other systems reviewed and are negative.   Physical Exam Updated Vital Signs BP (!) 145/94 (BP Location: Left Arm)   Pulse 67   Temp 98.8 F (37.1 C) (Oral)   Resp 17   Ht 5\' 6"  (1.676 m)   Wt 104.3 kg   SpO2 99%   BMI 37.12 kg/m   Physical Exam Vitals and nursing note reviewed.  Constitutional:      General: She is not in acute distress.    Appearance: She is well-developed.  HENT:     Head: Normocephalic and atraumatic.  Eyes:  Conjunctiva/sclera: Conjunctivae normal.  Cardiovascular:     Rate and Rhythm: Normal rate and regular rhythm.     Heart sounds: No murmur heard.   Pulmonary:     Effort: Pulmonary effort is normal. No respiratory distress.     Breath sounds: Normal breath sounds.  Abdominal:     Palpations: Abdomen is soft.     Tenderness: There is no abdominal tenderness.     Comments: No focal abdominal tenderness, no RLQ tenderness or pain at McBurney's point, no RUQ tenderness or Murphy's sign, no left-sided abdominal tenderness, no fluid wave, or signs of peritonitis   Musculoskeletal:        General: Normal range of motion.     Cervical back: Neck supple.  Skin:    General: Skin is warm and dry.  Neurological:     Mental Status: She is alert and oriented to person, place, and time.   Psychiatric:        Mood and Affect: Mood normal.        Behavior: Behavior normal.     ED Results / Procedures / Treatments   Labs (all labs ordered are listed, but only abnormal results are displayed) Labs Reviewed  URINALYSIS, ROUTINE W REFLEX MICROSCOPIC  CBC WITH DIFFERENTIAL/PLATELET  BASIC METABOLIC PANEL  I-STAT BETA HCG BLOOD, ED ([facility], [facility], [facility] ONLY)    EKG None  Radiology No results found.  Procedures Procedures   Medications Ordered in ED Medications  ondansetron (ZOFRAN) injection 4 mg (has no administration in time range)  sodium chloride 0.9 % bolus 1,000 mL (has no administration in time range)    ED Course  I have reviewed the triage vital signs and the nursing notes.  Pertinent labs & imaging results that were available during my care of the patient were reviewed by me and considered in my medical decision making (see chart for details).    MDM Rules/Calculators/A&P                          Patient here with nausea and vomiting that started about 4 hours prior to arrival.  She denies having any abdominal pain.  Denies any fevers or chills.  Denies any diarrhea.  Denies any urinary symptoms.  Patient given fluids and Zofran.  She is tolerating p.o.  Vital signs are stable.  She reports feeling improved.  She states she is ready to go home.  Work note provided.  Laboratory work-up is reassuring, mild nonspecific leukocytosis.  Pregnancy test negative. Final Clinical Impression(s) / ED Diagnoses Final diagnoses:  Non-intractable vomiting with nausea, unspecified vomiting type    Rx / DC Orders ED Discharge Orders         Ordered    ondansetron (ZOFRAN ODT) 4 MG disintegrating tablet  Every 8 hours PRN        11/26/20 0523           Roxy Horseman, PA-C 11/26/20 0524    Sabas Sous, MD 11/26/20 [phone]

## 2021-02-27 ENCOUNTER — Ambulatory Visit
Admission: EM | Admit: 2021-02-27 | Discharge: 2021-02-27 | Disposition: A | Discharge status: Home / Self Care | Payer: Self-pay | Attending: Internal Medicine | Admitting: Internal Medicine

## 2021-02-27 ENCOUNTER — Other Ambulatory Visit: Payer: Self-pay

## 2021-02-27 ENCOUNTER — Encounter: Payer: Self-pay | Admitting: Emergency Medicine

## 2021-02-27 ENCOUNTER — Ambulatory Visit (INDEPENDENT_AMBULATORY_CARE_PROVIDER_SITE_OTHER): Payer: Self-pay

## 2021-02-27 DIAGNOSIS — M25572 Pain in left ankle and joints of left foot: Secondary | ICD-10-CM

## 2021-02-27 DIAGNOSIS — S93402A Sprain of unspecified ligament of left ankle, initial encounter: Secondary | ICD-10-CM

## 2021-02-27 NOTE — Discharge Instructions (Addendum)
Gentle range of motion exercises Elevate left leg Icing of the left ankle Take NSAIDs for pain Your x-ray was negative for any fracture Return to urgent care if symptoms worsen

## 2021-02-27 NOTE — ED Triage Notes (Signed)
Pt rolled her ankle last night and is now having pain

## 2021-02-27 NOTE — ED Provider Notes (Signed)
[facility]    CSN: [id number] Arrival date & time: 02/27/21  1134      History   Chief Complaint No chief complaint on file.   HPI Savannah Contreras is a 25 y.o. female comes to the urgent care complaining of left ankle pain which started yesterday.  Patient was dancing and accidentally rolled her left ankle.  Patient describes the pain as sharp and of moderate severity.  Pain is aggravated by bearing weight or movement.  No known relieving factors.  It is associated with swelling of the left ankle.  No bruising. HPI  Past Medical History:  Diagnosis Date   Anxiety    Asthma    Eczema    Overweight 03/31/2013   Psoriasis 03/31/2013   Unspecified asthma(493.90) 03/31/2013    Patient Active Problem List   Diagnosis Date Noted   Moderate persistent asthma 12/06/2013   Allergic rhinitis 12/06/2013   Unspecified asthma(493.90) 03/31/2013   Overweight 03/31/2013   Psoriasis 03/31/2013   Ankle sprain 10/06/2012   High ankle sprain 10/06/2012    History reviewed. No pertinent surgical history.  OB History   No obstetric history on file.      Home Medications    Prior to Admission medications   Medication Sig Start Date End Date Taking? Authorizing Provider  albuterol (PROVENTIL HFA;VENTOLIN HFA) 108 (90 Base) MCG/ACT inhaler Inhale 2 puffs into the lungs every 6 (six) hours as needed for wheezing or shortness of breath. 11/08/15   Lurene Shadow, MD  Fructose-Dextrose-Phosphor Acd (ANTI-NAUSEA PO) Take 1 tablet by mouth once as needed (for nausea).    [provider]  ibuprofen (ADVIL,MOTRIN) 600 MG tablet Take 1 tablet (600 mg total) by mouth every 6 (six) hours as needed. 10/04/16   Burgess Amor, PA-C  Melatonin 5 MG TBDP Take 5 mg by mouth at bedtime.    [provider]  ondansetron (ZOFRAN ODT) 4 MG disintegrating tablet Take 1 tablet (4 mg total) by mouth every 8 (eight) hours as needed for nausea or vomiting. 11/26/20   Roxy Horseman, PA-C  ondansetron (ZOFRAN) 4 MG tablet Take 1 tablet (4 mg total) by mouth every 6 (six) hours. 08/28/16   Linwood Dibbles, MD    Family History Family History  Problem Relation Age of Onset   Heart disease Other    Cancer Other    Lung disease Other    Diabetes Other     Social History Social History   Tobacco Use   Smoking status: Every Day    Packs/day: 1.00    Years: 2.00    Pack years: 2.00    Types: Cigarettes   Smokeless tobacco: Never  Substance Use Topics   Alcohol use: No   Drug use: No     Allergies   Patient has no known allergies.   Review of Systems Review of Systems  Musculoskeletal:  Positive for arthralgias and joint swelling. Negative for myalgias, neck pain and neck stiffness.  Skin: Negative.   Neurological: Negative.     Physical Exam Triage Vital Signs ED Triage Vitals  Enc Vitals Group     BP 02/27/21 1142 136/88     Pulse Rate 02/27/21 1142 75     Resp 02/27/21 1142 16     Temp 02/27/21 1142 98.3 F (36.8 C)     Temp Source 02/27/21 1142 Tympanic     SpO2 02/27/21 1142 97 %     Weight --      Height --  Head Circumference --      Peak Flow --      Pain Score 02/27/21 1146 2     Pain Loc --      Pain Edu? --      Excl. in GC? --    No data found.  Updated Vital Signs BP 136/88 (BP Location: Right Arm)   Pulse 75   Temp 98.3 F (36.8 C) (Tympanic)   Resp 16   SpO2 97%   Visual Acuity Right Eye Distance:   Left Eye Distance:   Bilateral Distance:    Right Eye Near:   Left Eye Near:    Bilateral Near:     Physical Exam Vitals and nursing note reviewed.  Constitutional:      General: She is in acute distress.     Appearance: Normal appearance. She is not ill-appearing.  Cardiovascular:     Rate and Rhythm: Normal rate and regular rhythm.  Musculoskeletal:        General: Swelling, tenderness and signs of injury present. No deformity. Normal range of motion.  Skin:    General: Skin is warm.     Findings:  No bruising or erythema.  Neurological:     Mental Status: She is alert.     UC Treatments / Results  Labs (all labs ordered are listed, but only abnormal results are displayed) Labs Reviewed - No data to display  EKG   Radiology DG Ankle Complete Left  Result Date: 02/27/2021 CLINICAL DATA:  Left ankle pain EXAM: LEFT ANKLE COMPLETE - 3+ VIEW COMPARISON:  08/03/2017 FINDINGS: No acute fracture or dislocation. No aggressive osseous lesion. Normal alignment. Anterior tibiotalar marginal osteophytes. Possible osteochondral lesion involving the lateral corner of the talar dome. Soft tissue are unremarkable. No radiopaque foreign body or soft tissue emphysema. IMPRESSION: No acute osseous injury of the left ankle. Electronically Signed   By: Elige Ko   On: 02/27/2021 12:21    Procedures Procedures (including critical care time)  Medications Ordered in UC Medications - No data to display  Initial Impression / Assessment and Plan / UC Course  I have reviewed the triage vital signs and the nursing notes.  Pertinent labs & imaging results that were available during my care of the patient were reviewed by me and considered in my medical decision making (see chart for details).     1.  Left ankle sprain: X-ray of the left ankle is negative for acute fracture Elevation of the left leg of the left ankle NSAIDs-patient has NSAIDs at home Gentle range of motion exercises Ace wrap for the left ankle Return to urgent care if symptoms worsen. Final Clinical Impressions(s) / UC Diagnoses   Final diagnoses:  Moderate left ankle sprain, initial encounter     Discharge Instructions      Gentle range of motion exercises Elevate left leg Icing of the left ankle Take NSAIDs for pain Your x-ray was negative for any fracture Return to urgent care if symptoms worsen   ED Prescriptions   None    PDMP not reviewed this encounter.   Merrilee Jansky, MD 02/27/21 [phone]

## 2021-03-03 ENCOUNTER — Emergency Department (HOSPITAL_COMMUNITY)
Admission: EM | Admit: 2021-03-03 | Discharge: 2021-03-04 | Disposition: A | Discharge status: Home / Self Care | Payer: Self-pay | Attending: Emergency Medicine | Admitting: Emergency Medicine

## 2021-03-03 ENCOUNTER — Other Ambulatory Visit: Payer: Self-pay

## 2021-03-03 ENCOUNTER — Encounter (HOSPITAL_COMMUNITY): Payer: Self-pay

## 2021-03-03 DIAGNOSIS — J4540 Moderate persistent asthma, uncomplicated: Secondary | ICD-10-CM | POA: Insufficient documentation

## 2021-03-03 DIAGNOSIS — F419 Anxiety disorder, unspecified: Secondary | ICD-10-CM | POA: Insufficient documentation

## 2021-03-03 DIAGNOSIS — R251 Tremor, unspecified: Secondary | ICD-10-CM | POA: Insufficient documentation

## 2021-03-03 DIAGNOSIS — R002 Palpitations: Secondary | ICD-10-CM | POA: Insufficient documentation

## 2021-03-03 DIAGNOSIS — F17210 Nicotine dependence, cigarettes, uncomplicated: Secondary | ICD-10-CM | POA: Insufficient documentation

## 2021-03-03 NOTE — ED Triage Notes (Signed)
Pov from home with cc of panic attacks. States that they occur fairly frequently. Said she does not have any meds for it and would like some.  Alert calm and cooperative in triage. Denies si/hi

## 2021-03-04 ENCOUNTER — Other Ambulatory Visit: Payer: Self-pay

## 2021-03-04 MED ORDER — LORAZEPAM 1 MG PO TABS
2.0000 mg | ORAL_TABLET | Freq: Once | ORAL | Status: AC
  Administered 2021-03-04: 2 mg via ORAL
  Filled 2021-03-04: qty 2

## 2021-03-04 NOTE — ED Provider Notes (Signed)
[facility] EMERGENCY DEPARTMENT Provider Note   CSN: [id number] Arrival date & time: 03/03/21  2311     History Chief Complaint  Patient presents with   Panic Attack    Savannah Contreras is a 25 y.o. female.  25 year old female here with tremors and palpitations and not feeling well thinking that an anxiety attack.  Patient states she has a history of anxiety attacks.  However she also states that she had in binge drinking episode last night.  She states she drinks until 8:00 in the morning.  She slept throughout the day.  Since she is woken up she has had some tremors and anxiety and palpitations.  She states that she has never had this problem for this long when associated anxiety attacks and usually with anxiety is actually a little bit worse.  No suicidal homicidal ideation.  She is working on getting outpatient care for behavioral health reasons.   Anxiety      Past Medical History:  Diagnosis Date   Anxiety    Asthma    Eczema    Overweight 03/31/2013   Psoriasis 03/31/2013   Unspecified asthma(493.90) 03/31/2013    Patient Active Problem List   Diagnosis Date Noted   Moderate persistent asthma 12/06/2013   Allergic rhinitis 12/06/2013   Unspecified asthma(493.90) 03/31/2013   Overweight 03/31/2013   Psoriasis 03/31/2013   Ankle sprain 10/06/2012   High ankle sprain 10/06/2012    History reviewed. No pertinent surgical history.   OB History   No obstetric history on file.     Family History  Problem Relation Age of Onset   Heart disease Other    Cancer Other    Lung disease Other    Diabetes Other     Social History   Tobacco Use   Smoking status: Every Day    Packs/day: 1.00    Years: 2.00    Pack years: 2.00    Types: Cigarettes   Smokeless tobacco: Never  Substance Use Topics   Alcohol use: No   Drug use: No    Home Medications Prior to Admission medications   Medication Sig Start Date End Date Taking? Authorizing Provider  albuterol  (PROVENTIL HFA;VENTOLIN HFA) 108 (90 Base) MCG/ACT inhaler Inhale 2 puffs into the lungs every 6 (six) hours as needed for wheezing or shortness of breath. 11/08/15   Lurene Shadow, MD  Fructose-Dextrose-Phosphor Acd (ANTI-NAUSEA PO) Take 1 tablet by mouth once as needed (for nausea).    [provider]  ibuprofen (ADVIL,MOTRIN) 600 MG tablet Take 1 tablet (600 mg total) by mouth every 6 (six) hours as needed. 10/04/16   Burgess Amor, PA-C  Melatonin 5 MG TBDP Take 5 mg by mouth at bedtime.    [provider]  ondansetron (ZOFRAN ODT) 4 MG disintegrating tablet Take 1 tablet (4 mg total) by mouth every 8 (eight) hours as needed for nausea or vomiting. 11/26/20   Roxy Horseman, PA-C  ondansetron (ZOFRAN) 4 MG tablet Take 1 tablet (4 mg total) by mouth every 6 (six) hours. 08/28/16   Linwood Dibbles, MD    Allergies    Patient has no known allergies.  Review of Systems   Review of Systems  All other systems reviewed and are negative.  Physical Exam Updated Vital Signs BP 127/81   Pulse 61   Temp 98.2 F (36.8 C) (Oral)   Resp 10   Ht 5\' 6"  (1.676 m)   Wt 104.3 kg   SpO2 99%  BMI 37.11 kg/m   Physical Exam Vitals and nursing note reviewed.  Constitutional:      Appearance: She is well-developed.  HENT:     Head: Normocephalic and atraumatic.     Mouth/Throat:     Mouth: Mucous membranes are moist.     Pharynx: Oropharynx is clear.  Eyes:     Pupils: Pupils are equal, round, and reactive to light.  Cardiovascular:     Rate and Rhythm: Normal rate and regular rhythm.  Pulmonary:     Effort: No respiratory distress.     Breath sounds: No stridor.  Abdominal:     General: Abdomen is flat. There is no distension.  Musculoskeletal:        General: No swelling or tenderness. Normal range of motion.     Cervical back: Normal range of motion.  Skin:    General: Skin is warm and dry.     Coloration: Skin is not jaundiced or pale.  Neurological:      General: No focal deficit present.     Mental Status: She is alert.     Comments: Mild bilateral hand tremors.  Psychiatric:        Mood and Affect: Mood normal.        Behavior: Behavior normal.    ED Results / Procedures / Treatments   Labs (all labs ordered are listed, but only abnormal results are displayed) Labs Reviewed - No data to display  EKG None  Radiology No results found.  Procedures Procedures   Medications Ordered in ED Medications  LORazepam (ATIVAN) tablet 2 mg (2 mg Oral Given 03/04/21 0103)    ED Course  I have reviewed the triage vital signs and the nursing notes.  Pertinent labs & imaging results that were available during my care of the patient were reviewed by me and considered in my medical decision making (see chart for details).    MDM Rules/Calculators/A&P                          I suspect that this could likely just be a hangover/acute withdrawal from her binge drinking of alcohol last night.  Either way we will give 1 dose of Ativan and refer to outpatient resources for further management.  Her EKG is okay have low suspicion for any kind of electrolyte abnormality or arrhythmia at this time.  Final Clinical Impression(s) / ED Diagnoses Final diagnoses:  Occasional tremors  Palpitations    Rx / DC Orders ED Discharge Orders     None        Chanice Brenton, Barbara Cower, MD 03/04/21 [phone]

## 2022-01-19 ENCOUNTER — Encounter (HOSPITAL_COMMUNITY): Payer: Self-pay

## 2022-01-19 ENCOUNTER — Emergency Department (HOSPITAL_COMMUNITY)
Admission: EM | Admit: 2022-01-19 | Discharge: 2022-01-20 | Disposition: A | Discharge status: Home / Self Care | Payer: Self-pay | Attending: Emergency Medicine | Admitting: Emergency Medicine

## 2022-01-19 ENCOUNTER — Emergency Department (HOSPITAL_COMMUNITY): Payer: Self-pay

## 2022-01-19 DIAGNOSIS — X58XXXA Exposure to other specified factors, initial encounter: Secondary | ICD-10-CM | POA: Insufficient documentation

## 2022-01-19 DIAGNOSIS — S82891A Other fracture of right lower leg, initial encounter for closed fracture: Secondary | ICD-10-CM | POA: Insufficient documentation

## 2022-01-19 NOTE — ED Provider Triage Note (Signed)
Emergency Medicine Provider Triage Evaluation Note  BRENLYNN FAKE , a 26 y.o. female  was evaluated in triage.  Pt complains of R ankle injury x 2 days. Hx of previous sprains abd fractures. + swellng. Wanted to get it " checked out"  Review of Systems  Positive: Ankle pain  Negative: weakness  Physical Exam  BP (!) 147/82   Pulse 68   Temp 98.5 F (36.9 C) (Oral)   Resp 16   SpO2 98%  Gen:   Awake, no distress   Resp:  Normal effort  MSK:   Moves extremities without difficulty  Other:  Swelling R ankle  Medical Decision Making  Medically screening exam initiated at 10:44 PM.  Appropriate orders placed.  JAZELLE ACHEY was informed that the remainder of the evaluation will be completed by another provider, this initial triage assessment does not replace that evaluation, and the importance of remaining in the ED until their evaluation is complete.  Imaging ordered.   Arthor Captain, PA-C 01/19/22 2247

## 2022-01-19 NOTE — ED Triage Notes (Signed)
Pt states that she rolled her ankle on Friday night, swelling has increased

## 2022-01-20 NOTE — ED Provider Notes (Signed)
[facility] EMERGENCY DEPARTMENT Provider Note   CSN: [id number] Arrival date & time: 01/19/22  2237     History  Chief Complaint  Patient presents with   Ankle Pain    Savannah Contreras is a 26 y.o. female.  The history is provided by the patient and medical records.  Ankle Pain  25 y.o. F presenting to the ED with right ankle injury.  States she rolled her ankle 3-4x on Friday, worsening pain/swelling/bruising since that time.  She remains ambulatory.  Reports some mild tingling of the toes but no numbness.  Has been icing and taking OTC NSAIDs.  Was concerned for fracture so wanted x-rays to be sure.    Home Medications Prior to Admission medications   Medication Sig Start Date End Date Taking? Authorizing Provider  albuterol (PROVENTIL HFA;VENTOLIN HFA) 108 (90 Base) MCG/ACT inhaler Inhale 2 puffs into the lungs every 6 (six) hours as needed for wheezing or shortness of breath. 11/08/15   Lurene Shadow, MD  Fructose-Dextrose-Phosphor Acd (ANTI-NAUSEA PO) Take 1 tablet by mouth once as needed (for nausea).    [provider]  ibuprofen (ADVIL,MOTRIN) 600 MG tablet Take 1 tablet (600 mg total) by mouth every 6 (six) hours as needed. 10/04/16   Burgess Amor, PA-C  Melatonin 5 MG TBDP Take 5 mg by mouth at bedtime.    [provider]  ondansetron (ZOFRAN ODT) 4 MG disintegrating tablet Take 1 tablet (4 mg total) by mouth every 8 (eight) hours as needed for nausea or vomiting. 11/26/20   Roxy Horseman, PA-C  ondansetron (ZOFRAN) 4 MG tablet Take 1 tablet (4 mg total) by mouth every 6 (six) hours. 08/28/16   Linwood Dibbles, MD      Allergies    Patient has no known allergies.    Review of Systems   Review of Systems  Musculoskeletal:  Positive for arthralgias.  All other systems reviewed and are negative.  Physical Exam Updated Vital Signs BP (!) 147/82   Pulse 68   Temp 98.5 F (36.9 C) (Oral)   Resp 16   SpO2 98%   Physical  Exam Vitals and nursing note reviewed.  Constitutional:      Appearance: She is well-developed.  HENT:     Head: Normocephalic and atraumatic.  Eyes:     Conjunctiva/sclera: Conjunctivae normal.     Pupils: Pupils are equal, round, and reactive to light.  Cardiovascular:     Rate and Rhythm: Normal rate and regular rhythm.     Heart sounds: Normal heart sounds.  Pulmonary:     Effort: Pulmonary effort is normal.     Breath sounds: Normal breath sounds.  Abdominal:     General: Bowel sounds are normal.     Palpations: Abdomen is soft.  Musculoskeletal:        General: Normal range of motion.     Cervical back: Normal range of motion.     Comments: Right ankle swelling, more so along the lateral aspect, there is some bruising along lateral right foot, DP pulse intact, moving toes normally  Skin:    General: Skin is warm and dry.  Neurological:     Mental Status: She is alert and oriented to person, place, and time.    ED Results / Procedures / Treatments   Labs (all labs ordered are listed, but only abnormal results are displayed) Labs Reviewed - No data to display  EKG None  Radiology DG Ankle Complete Right  Result  Date: 01/19/2022 CLINICAL DATA:  Ankle injury EXAM: RIGHT ANKLE - COMPLETE 3+ VIEW COMPARISON:  11/29/2012 FINDINGS: Possible tiny cortical avulsion at the tip of the lateral fibular malleolus. Overlying soft tissue swelling. Mortise is symmetric. Potential cortex avulsion off the anterior inferior tibia on the lateral view. IMPRESSION: 1. Possible subtle cortical avulsion injuries at the tip of lateral fibular malleolus and anteroinferior tibia. 2. Soft tissue swelling Electronically Signed   By: Jasmine Pang M.D.   On: 01/19/2022 23:22    Procedures Procedures    Medications Ordered in ED Medications - No data to display  ED Course/ Medical Decision Making/ A&P                           Medical Decision Making  26 year old female here with right ankle  injury that occurred 2 days ago after she rolled it several times.  There was no head injury or loss of consciousness.  Reports persistent pain and swelling, however is able to ambulate.  Denies any focal numbness of the foot.  On exam she has swelling surrounding the lateral malleolus, there is some bruising as well.  DP pulse intact, wiggling toes on command.  No gross deformity.  X-ray reviewed, subtle cortical avulsion injury at lateral fibular malleolus and anteroinferior tibia.  Patient placed in cam walker, has crutches at home that she will use.  She has been seen by Dr. Romeo Apple in [place] for prior orthopedic injuries and will prefer to stick with him.  She will call for appointment.  She declined pain medication.  Can return here for any new or acute changes.  Final Clinical Impression(s) / ED Diagnoses Final diagnoses:  Closed avulsion fracture of right ankle, initial encounter    Rx / DC Orders ED Discharge Orders     None         Garlon Hatchet, PA-C 01/20/22 0250    Tilden Fossa, MD 01/21/22 0300

## 2022-01-20 NOTE — Discharge Instructions (Addendum)
Tylenol or motrin as needed for pain. Can continue to ice/elevate to help with swelling. Follow-up with Dr. Romeo Apple if ongoing issues. Return to the ED for new or worsening symptoms.

## 2022-01-20 NOTE — Progress Notes (Signed)
Orthopedic Tech Progress Note Patient Details:  Savannah Contreras [birth year] [id number]  Ortho Devices Type of Ortho Device: CAM walker Ortho Device/Splint Location: rle Ortho Device/Splint Interventions: Ordered, Application, Adjustment   Post Interventions Patient Tolerated: Well Instructions Provided: Care of device, Adjustment of device  Karolee Stamps 01/20/2022, 2:48 AM

## 2022-01-20 NOTE — ED Notes (Signed)
E-signature pad unavailable at time of pt discharge. This RN discussed discharge materials with pt and answered all pt questions. Pt stated understanding of discharge material. ? ?

## 2022-01-28 ENCOUNTER — Telehealth: Payer: Self-pay | Admitting: Orthopedic Surgery

## 2022-01-28 NOTE — Telephone Encounter (Signed)
Patient called to request to cancel 'new pt/new problem visit following Emergency room visit' for:  "Possible tiny cortical avulsion at the tip of the lateral fibular malleolus. Overlying soft tissue swelling. Mortise is symmetric. Potential cortex avulsion off the anterior inferior tibia on the lateral view. IMPRESSION: 1. Possible subtle cortical avulsion injuries at the tip of lateral fibular malleolus and anteroinferior tibia"  - offered reschedule, all options.. Patient said it is doing better, said she's injured it many times before, and is in boot. Understands importance of orthopaedic evaluation and treatment., and said will call back if needs to reschedule.

## 2022-01-29 ENCOUNTER — Ambulatory Visit: Payer: Self-pay | Admitting: Orthopedic Surgery

## 2022-08-04 IMAGING — DX DG ANKLE COMPLETE 3+V*L*
3 series · 3 of 3 positions shown · non-contrast
Comparison: 08/03/2017

CLINICAL DATA: Left ankle pain

EXAM:
LEFT ANKLE COMPLETE - 3+ VIEW

[ankle ap]
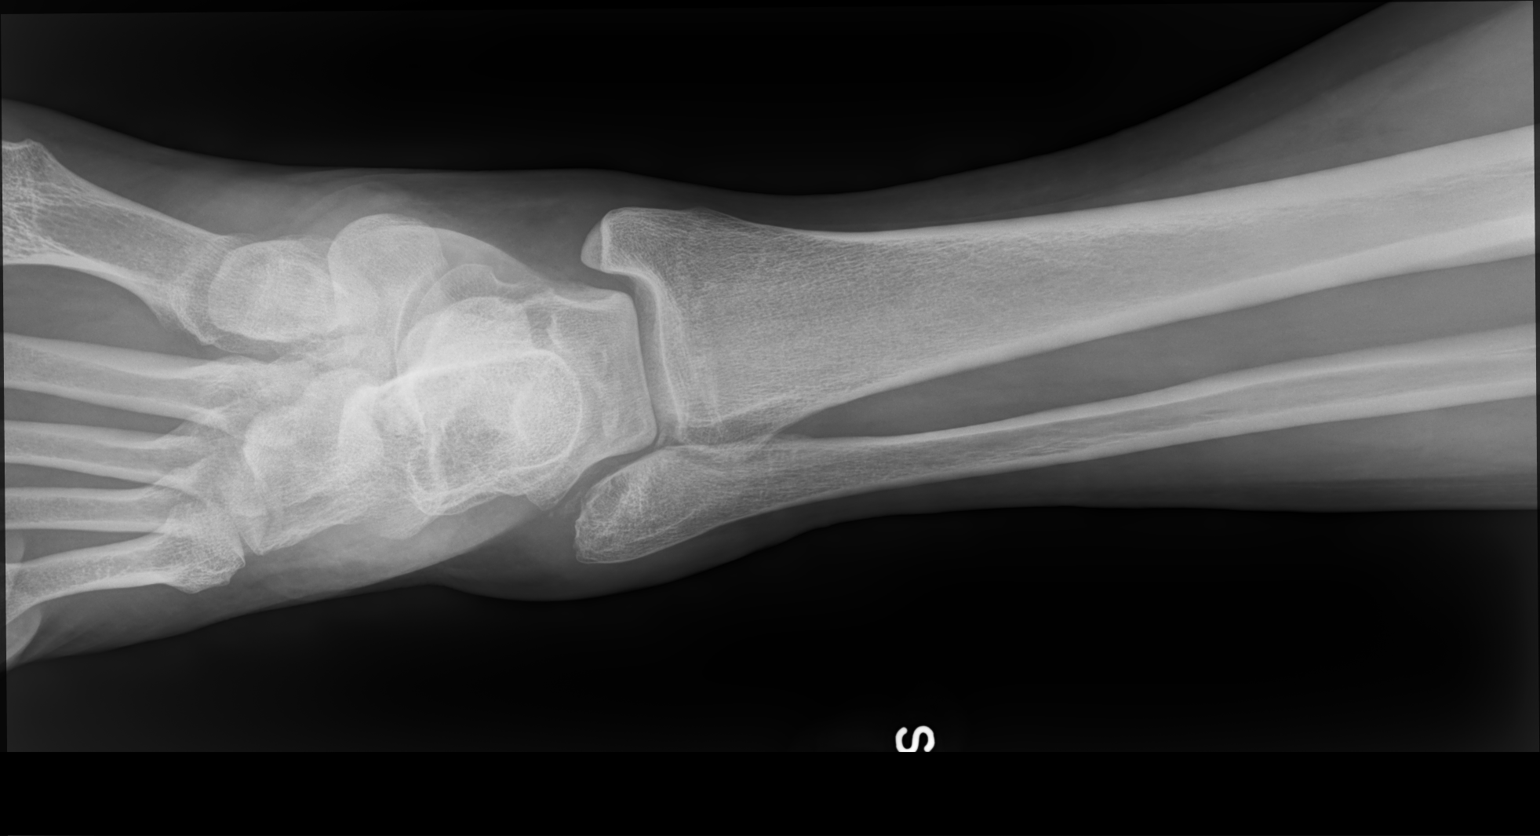

[ankle mlo]
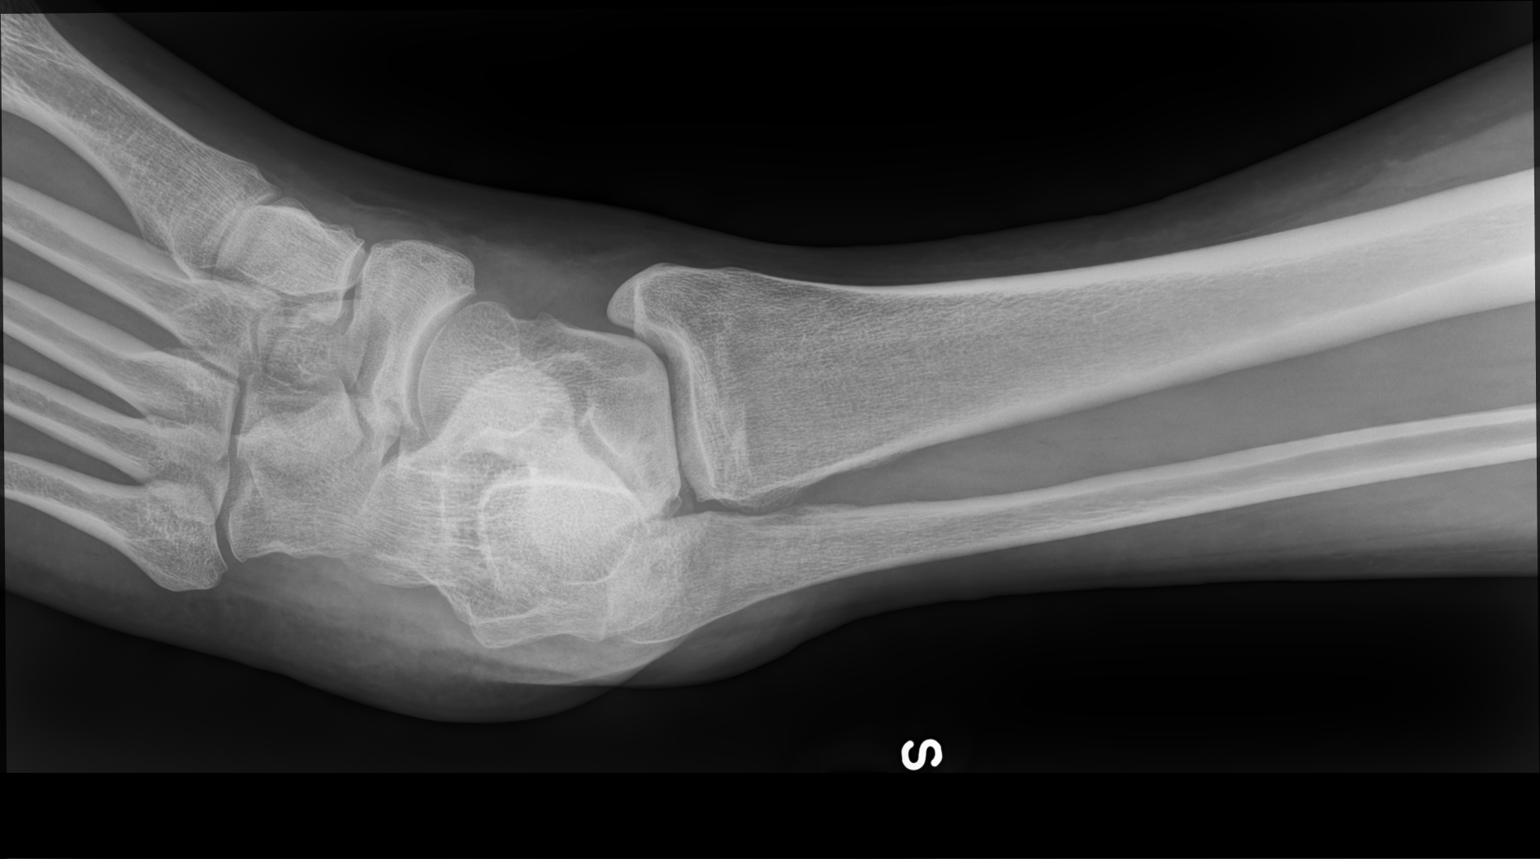

[ankle lat]
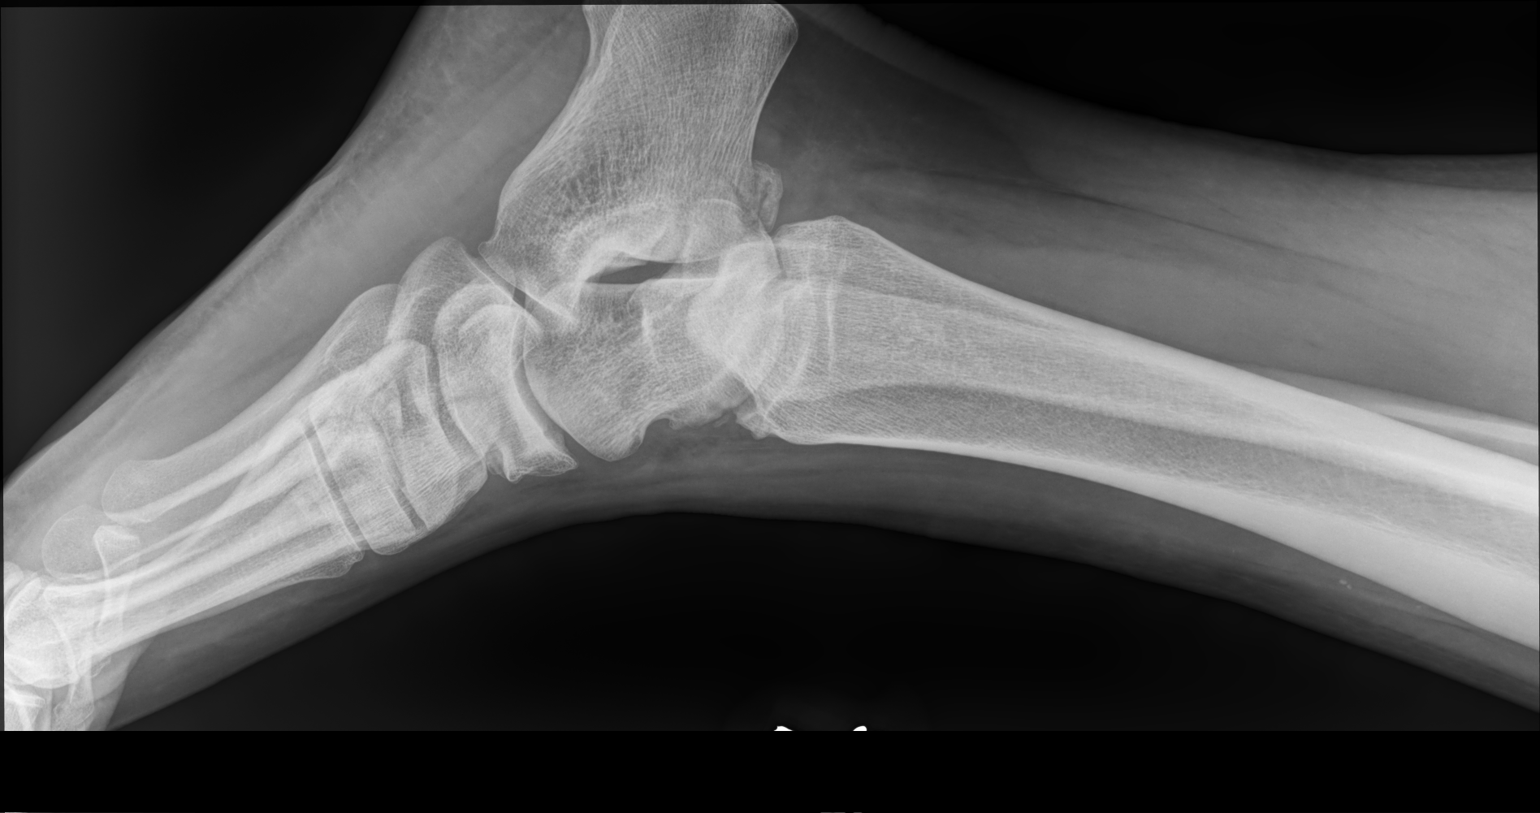

[3 of 3 positions shown; findings below may reference images not displayed]

FINDINGS: No acute fracture or dislocation. No aggressive osseous lesion.
Normal alignment. Anterior tibiotalar marginal osteophytes. Possible
osteochondral lesion involving the lateral corner of the talar dome.

Soft tissue are unremarkable. No radiopaque foreign body or soft
tissue emphysema.
IMPRESSION: No acute osseous injury of the left ankle.

## 2022-10-16 ENCOUNTER — Encounter: Payer: Self-pay | Admitting: Radiology

## 2023-10-13 ENCOUNTER — Ambulatory Visit
Admission: EM | Admit: 2023-10-13 | Discharge: 2023-10-13 | Disposition: A | Discharge status: Home / Self Care | Payer: Self-pay | Attending: Nurse Practitioner | Admitting: Nurse Practitioner

## 2023-10-13 ENCOUNTER — Encounter: Payer: Self-pay | Admitting: Emergency Medicine

## 2023-10-13 ENCOUNTER — Ambulatory Visit (HOSPITAL_COMMUNITY)
Admission: RE | Admit: 2023-10-13 | Discharge: 2023-10-13 | Disposition: A | Discharge status: Home / Self Care | Payer: Self-pay | Source: Ambulatory Visit | Attending: Nurse Practitioner | Admitting: Nurse Practitioner

## 2023-10-13 DIAGNOSIS — M25571 Pain in right ankle and joints of right foot: Secondary | ICD-10-CM | POA: Insufficient documentation

## 2023-10-13 DIAGNOSIS — M25471 Effusion, right ankle: Secondary | ICD-10-CM | POA: Insufficient documentation

## 2023-10-13 NOTE — ED Triage Notes (Signed)
 Rolled right ankle 2 weeks ago.  Feels pain shooting to lower leg and toes.  Lower right leg is bruised.

## 2023-10-13 NOTE — ED Provider Notes (Signed)
 [facility]    CSN: [id number] Arrival date & time: 10/13/23  1012      History   Chief Complaint No chief complaint on file.   HPI Savannah Contreras is a 28 y.o. female.   Patient presents today with 2 week history of right ankle pain.  She reports when pain first began, she had twisted her ankle going down bleachers.  Reports the entire ankle become swollen and bruised.  She reports history of ankle fractures and sprains to the same ankle and bought an Ace wrap and started wearing it with very minimal improvement.  Reports after wearing the Ace wrap, the back of her ankle became more bruised and the outside of her ankle is more bruised.  She is most tender on the inside of her right ankle.  She also has some intermittent numbness/tingling in the right great toe.  She denies decreased range of motion of the ankle or foot.    Past Medical History:  Diagnosis Date   Anxiety    Asthma    Eczema    Overweight 03/31/2013   Psoriasis 03/31/2013   Unspecified asthma(493.90) 03/31/2013    Patient Active Problem List   Diagnosis Date Noted   Moderate persistent asthma 12/06/2013   Allergic rhinitis 12/06/2013   Asthma 03/31/2013   Overweight 03/31/2013   Psoriasis 03/31/2013   Ankle sprain 10/06/2012   High ankle sprain 10/06/2012    History reviewed. No pertinent surgical history.  OB History   No obstetric history on file.      Home Medications    Prior to Admission medications   Not on File    Family History Family History  Problem Relation Age of Onset   Heart disease Other    Cancer Other    Lung disease Other    Diabetes Other     Social History Social History   Tobacco Use   Smoking status: Every Day    Current packs/day: 1.00    Average packs/day: 1 pack/day for 2.0 years (2.0 ttl pk-yrs)    Types: Cigarettes   Smokeless tobacco: Never  Vaping Use   Vaping status: Never Used  Substance Use Topics   Alcohol use: No   Drug use: No      Allergies   Patient has no known allergies.   Review of Systems Review of Systems Per HPI  Physical Exam Triage Vital Signs ED Triage Vitals  Encounter Vitals Group     BP 10/13/23 1142 (!) 143/86     Systolic BP Percentile --      Diastolic BP Percentile --      Pulse Rate 10/13/23 1142 61     Resp 10/13/23 1142 18     Temp 10/13/23 1142 98.1 F (36.7 C)     Temp Source 10/13/23 1142 Oral     SpO2 10/13/23 1142 98 %     Weight --      Height --      Head Circumference --      Peak Flow --      Pain Score 10/13/23 1144 6     Pain Loc --      Pain Education --      Exclude from Growth Chart --    No data found.  Updated Vital Signs BP (!) 143/86 (BP Location: Right Arm)   Pulse 61   Temp 98.1 F (36.7 C) (Oral)   Resp 18   LMP 10/07/2023 (Exact Date)  SpO2 98%   Visual Acuity Right Eye Distance:   Left Eye Distance:   Bilateral Distance:    Right Eye Near:   Left Eye Near:    Bilateral Near:     Physical Exam Vitals and nursing note reviewed.  Constitutional:      General: She is not in acute distress.    Appearance: Normal appearance. She is not toxic-appearing.  HENT:     Mouth/Throat:     Mouth: Mucous membranes are moist.     Pharynx: Oropharynx is clear.  Pulmonary:     Effort: Pulmonary effort is normal. No respiratory distress.  Musculoskeletal:     Comments: Inspection: Swelling noted to right lateral malleolus; bruising noted to posterior upper ankle; no obvious deformity or redness Palpation: tender to palpation right medial malleolus; patient is nontender over area of bruising; patient is nontender over area of swelling; no obvious deformities palpated ROM: Full ROM to right lower extremity Strength: 5/5 right lower extremity Neurovascular: neurovascularly intact in distal right lower extremity  Skin:    General: Skin is warm and dry.     Capillary Refill: Capillary refill takes less than 2 seconds.     Coloration: Skin is not  jaundiced or pale.     Findings: No erythema.  Neurological:     Mental Status: She is alert and oriented to person, place, and time.  Psychiatric:        Behavior: Behavior is cooperative.      UC Treatments / Results  Labs (all labs ordered are listed, but only abnormal results are displayed) Labs Reviewed - No data to display  EKG   Radiology DG Ankle Complete Right Result Date: 10/13/2023 CLINICAL DATA:  Fall and right ankle pain. EXAM: RIGHT ANKLE - COMPLETE 3+ VIEW COMPARISON:  Right ankle radiograph dated 01/19/2022. FINDINGS: No acute fracture or dislocation. Small old bone fragment along the tip of the lateral malleolus. The bones are well mineralized. No significant arthritic changes. The ankle mortise is intact. Mild soft tissue swelling over the lateral ankle. IMPRESSION: 1. No acute fracture or dislocation. 2. Mild soft tissue swelling over the lateral ankle. Electronically Signed   By: Elgie Collard M.D.   On: 10/13/2023 13:45    Procedures Procedures (including critical care time)  Medications Ordered in UC Medications - No data to display  Initial Impression / Assessment and Plan / UC Course  I have reviewed the triage vital signs and the nursing notes.  Pertinent labs & imaging results that were available during my care of the patient were reviewed by me and considered in my medical decision making (see chart for details).   Patient is well-appearing, normotensive, afebrile, not tachycardic, not tachypneic, oxygenating well on room air.    1. Acute right ankle pain Suspect sprain Will obtain x-ray imaging as an outpatient at local emergency room given shortage of x-ray technician today in urgent care In the meantime, start cam boot to help provide support Other supportive care discussed Work excuse provided Follow-up with Ortho if symptoms do not improve with treatment  Update: X-ray imaging is negative for acute fractures or dislocations.  I called  patient and discussed x-ray results.  Continue with treatment plan as discussed.  All questions answered.  The patient was given the opportunity to ask questions.  All questions answered to their satisfaction.  The patient is in agreement to this plan.    Final Clinical Impressions(s) / UC Diagnoses   Final diagnoses:  Acute  right ankle pain     Discharge Instructions      After leaving urgent care today, please go to [facility] imaging department to have ankle x-ray completed.  I will contact you later today with the results of the ankle x-ray.  In the meantime, recommend wearing the cam boot when weightbearing to help provide support and offload pressure from your ankle.  You can take Tylenol or ibuprofen as needed for pain.  When sitting down, elevate your ankle and apply ice as needed for pain and swelling.    ED Prescriptions   None    PDMP not reviewed this encounter.   Valentino Nose, NP 10/13/23 [phone]

## 2023-10-13 NOTE — Discharge Instructions (Addendum)
 After leaving urgent care today, please go to [facility] imaging department to have ankle x-ray completed.  I will contact you later today with the results of the ankle x-ray.  In the meantime, recommend wearing the cam boot when weightbearing to help provide support and offload pressure from your ankle.  You can take Tylenol or ibuprofen as needed for pain.  When sitting down, elevate your ankle and apply ice as needed for pain and swelling.
